# Patient Record
Sex: Male | Born: 2005 | Race: Black or African American | Hispanic: No | Marital: Single | Smoking: Never smoker
Health system: Southern US, Community
[De-identification: ages and names within clinical notes are randomized; demographics above are authoritative.]

---
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
## 2024-01-11 ENCOUNTER — Encounter (HOSPITAL_COMMUNITY): Payer: Self-pay

## 2024-01-11 ENCOUNTER — Ambulatory Visit (HOSPITAL_COMMUNITY)
Admission: EM | Admit: 2024-01-11 | Discharge: 2024-01-11 | Disposition: A | Discharge status: Home / Self Care | Attending: Internal Medicine | Admitting: Internal Medicine

## 2024-01-11 DIAGNOSIS — J020 Streptococcal pharyngitis: Secondary | ICD-10-CM

## 2024-01-11 DIAGNOSIS — J029 Acute pharyngitis, unspecified: Secondary | ICD-10-CM

## 2024-01-11 LAB — POCT RAPID STREP A (OFFICE): Rapid Strep A Screen: POSITIVE — AB

## 2024-01-11 LAB — POCT MONO SCREEN (KUC): Mono, POC: NEGATIVE

## 2024-01-11 MED ORDER — DEXAMETHASONE SOD PHOSPHATE PF 10 MG/ML IJ SOLN
10.0000 mg | Freq: Once | INTRAMUSCULAR | Status: AC
  Administered 2024-01-11: 10 mg via INTRAMUSCULAR

## 2024-01-11 MED ORDER — AMOXICILLIN 400 MG/5ML PO SUSR: 875.0000 mg | Freq: Two times a day (BID) | ORAL | 0 refills | Status: AC

## 2024-01-11 MED ORDER — PREDNISOLONE 15 MG/5ML PO SOLN: 60.0000 mg | Freq: Every day | ORAL | 0 refills | Status: AC

## 2024-01-11 NOTE — Discharge Instructions (Addendum)
 Strep testing done today is positive. Mono testing done today is negative.  We will treat the strep throat with antibiotics by mouth.  Due to the significant swelling present we have given an injection of a steroid called Decadron to help decrease this.  We will also send home on steroids by mouth.  Will treat with the following recommendations: Decadron injection given today. This is a steroid to help with inflammation, swelling and pain.  Amoxicillin 10.9 mLs twice a day for 10 days. This is an antibiotic. Take this with food.   Start 01/12/24 Prednisolone (Orapred) 20 mLs once a day for 4 days. This is a steroid. It helps with inflammation. Take this in the morning. Take with food.   Make sure to stay hydrated by drinking plenty of water. If it anytime you develop inability to swallow at all or difficulty breathing then you need to go to the emergency room immediately. Return to urgent care or PCP if symptoms worsen or fail to resolve.

## 2024-01-11 NOTE — ED Provider Notes (Signed)
 [facility]    CSN: [id number] Arrival date & time: 01/11/24  1255      History   Chief Complaint Chief Complaint  Patient presents with   Sore Throat    HPI Jonathan Black is a 18 y.o. male.   18 year old male presents urgent care with complaints of a sore throat and trouble swallowing.  He reports that he for started having symptoms about 5 to 6 days ago but over the last 2 days it has gotten much worse.  He reports he is having to spit a lot because the pain makes it difficult to swallow.  He is still eating and drinking but it is difficult.  He denies any fevers, chills, difficulty breathing or known sick contacts.   Sore Throat Pertinent negatives include no chest pain, no abdominal pain and no shortness of breath.    History reviewed. No pertinent past medical history.  There are no active problems to display for this patient.   History reviewed. No pertinent surgical history.     Home Medications    Prior to Admission medications   Medication Sig Start Date End Date Taking? Authorizing Provider  amoxicillin (AMOXIL) 400 MG/5ML suspension Take 10.9 mLs (875 mg total) by mouth 2 (two) times daily for 10 days. 01/11/24 01/21/24 Yes Rojean Ige A, PA-C  prednisoLONE (PRELONE) 15 MG/5ML SOLN Take 20 mLs (60 mg total) by mouth daily before breakfast for 4 days. 01/11/24 01/15/24 Yes Jericho Alcorn A, PA-C  ibuprofen  (ADVIL ,MOTRIN ) 800 MG tablet Take 1 tablet (800 mg total) by mouth 3 (three) times daily. 12/08/17   Maranda Jamee Jacob, MD  lactobacillus acidophilus & bulgar (LACTINEX) chewable tablet Chew 1 tablet by mouth 3 (three) times daily with meals. For 5 days for diarrhea 05/12/14   Susy Pierce, MD    Family History Family History  Problem Relation Age of Onset   Healthy Mother     Social History Social History   Tobacco Use   Smoking status: Never   Smokeless tobacco: Never  Vaping Use   Vaping status: Never Used  Substance Use Topics    Alcohol use: Never   Drug use: Never     Allergies   Patient has no known allergies.   Review of Systems Review of Systems  Constitutional:  Negative for chills and fever.  HENT:  Positive for sore throat and trouble swallowing. Negative for ear pain.   Eyes:  Negative for pain and visual disturbance.  Respiratory:  Negative for cough and shortness of breath.   Cardiovascular:  Negative for chest pain and palpitations.  Gastrointestinal:  Negative for abdominal pain and vomiting.  Genitourinary:  Negative for dysuria and hematuria.  Musculoskeletal:  Negative for arthralgias and back pain.  Skin:  Negative for color change and rash.  Neurological:  Negative for seizures and syncope.  All other systems reviewed and are negative.    Physical Exam Triage Vital Signs ED Triage Vitals  Encounter Vitals Group     BP 01/11/24 1352 119/73     Girls Systolic BP Percentile --      Girls Diastolic BP Percentile --      Boys Systolic BP Percentile --      Boys Diastolic BP Percentile --      Pulse Rate 01/11/24 1352 90     Resp 01/11/24 1352 20     Temp 01/11/24 1352 98.3 F (36.8 C)     Temp Source 01/11/24 1352 Oral  SpO2 01/11/24 1352 96 %     Weight --      Height 01/11/24 1349 5' 6 (1.676 m)     Head Circumference --      Peak Flow --      Pain Score 01/11/24 1349 10     Pain Loc --      Pain Education --      Exclude from Growth Chart --    No data found.  Updated Vital Signs BP 119/73 (BP Location: Right Arm)   Pulse 90   Temp 98.3 F (36.8 C) (Oral)   Resp 20   Ht 5' 6 (1.676 m)   SpO2 96%   Visual Acuity Right Eye Distance:   Left Eye Distance:   Bilateral Distance:    Right Eye Near:   Left Eye Near:    Bilateral Near:     Physical Exam Vitals and nursing note reviewed.  Constitutional:      General: He is not in acute distress.    Appearance: He is well-developed. He is not ill-appearing, toxic-appearing or diaphoretic.  HENT:     Head:  Normocephalic and atraumatic.     Comments: Mild cervical lymphadenopathy    Mouth/Throat:     Mouth: Mucous membranes are moist.     Pharynx: Uvula midline. Pharyngeal swelling and posterior oropharyngeal erythema present. No oropharyngeal exudate.     Tonsils: No tonsillar abscesses. 4+ on the right. 4+ on the left.  Eyes:     Conjunctiva/sclera: Conjunctivae normal.     Pupils: Pupils are equal, round, and reactive to light.  Cardiovascular:     Rate and Rhythm: Normal rate and regular rhythm.     Heart sounds: No murmur heard. Pulmonary:     Effort: Pulmonary effort is normal. No respiratory distress.     Breath sounds: Normal breath sounds.  Abdominal:     Palpations: Abdomen is soft.     Tenderness: There is no abdominal tenderness.  Musculoskeletal:        General: No swelling.     Cervical back: Neck supple.  Skin:    General: Skin is warm and dry.     Capillary Refill: Capillary refill takes less than 2 seconds.  Neurological:     Mental Status: He is alert.  Psychiatric:        Mood and Affect: Mood normal.      UC Treatments / Results  Labs (all labs ordered are listed, but only abnormal results are displayed) Labs Reviewed  POCT RAPID STREP A (OFFICE) - Abnormal; Notable for the following components:      Result Value   Rapid Strep A Screen Positive (*)    All other components within normal limits  POCT MONO SCREEN ([facility])    EKG   Radiology No results found.  Procedures Procedures (including critical care time)  Medications Ordered in UC Medications  dexamethasone (DECADRON) injection 10 mg (has no administration in time range)    Initial Impression / Assessment and Plan / UC Course  I have reviewed the triage vital signs and the nursing notes.  Pertinent labs & imaging results that were available during my care of the patient were reviewed by me and considered in my medical decision making (see chart for details).     Strep  pharyngitis  Sore throat - Plan: POC mono screen, POC rapid strep A, POC mono screen, POC rapid strep A   Strep testing done today is positive. Mono testing done  today is negative.  We will treat the strep throat with antibiotics by mouth.  Due to the significant swelling present we have given an injection of a steroid called Decadron to help decrease this.  We will also send home on steroids by mouth.  Will treat with the following recommendations: Decadron injection given today. This is a steroid to help with inflammation, swelling and pain.  Amoxicillin 10.9 mLs twice a day for 10 days. This is an antibiotic. Take this with food.   Start 01/12/24 Prednisolone (Orapred) 20 mLs once a day for 4 days. This is a steroid. It helps with inflammation. Take this in the morning. Take with food.   Make sure to stay hydrated by drinking plenty of water. If it anytime you develop inability to swallow at all or difficulty breathing then you need to go to the emergency room immediately. Return to urgent care or PCP if symptoms worsen or fail to resolve.    Final Clinical Impressions(s) / UC Diagnoses   Final diagnoses:  Sore throat  Strep pharyngitis     Discharge Instructions      Strep testing done today is positive. Mono testing done today is negative.  We will treat the strep throat with antibiotics by mouth.  Due to the significant swelling present we have given an injection of a steroid called Decadron to help decrease this.  We will also send home on steroids by mouth.  Will treat with the following recommendations: Decadron injection given today. This is a steroid to help with inflammation, swelling and pain.  Amoxicillin 10.9 mLs twice a day for 10 days. This is an antibiotic. Take this with food.   Start 01/12/24 Prednisolone (Orapred) 20 mLs once a day for 4 days. This is a steroid. It helps with inflammation. Take this in the morning. Take with food.   Make sure to stay hydrated by  drinking plenty of water. If it anytime you develop inability to swallow at all or difficulty breathing then you need to go to the emergency room immediately. Return to urgent care or PCP if symptoms worsen or fail to resolve.      ED Prescriptions     Medication Sig Dispense Auth. Provider   amoxicillin (AMOXIL) 400 MG/5ML suspension Take 10.9 mLs (875 mg total) by mouth 2 (two) times daily for 10 days. 218 mL Judia Arnott A, PA-C   prednisoLONE (PRELONE) 15 MG/5ML SOLN Take 20 mLs (60 mg total) by mouth daily before breakfast for 4 days. 80 mL Teresa Almarie LABOR, [place]      PDMP not reviewed this encounter.   Teresa Almarie LABOR, [place] 01/11/24 1441

## 2024-01-11 NOTE — ED Triage Notes (Signed)
Pt states that he has a sore throat. X2 days
